# Patient Record
Sex: Female | Born: 1970 | Hispanic: Yes | Marital: Single | Smoking: Never smoker
Health system: Southern US, Community
[De-identification: ages and names within clinical notes are randomized; demographics above are authoritative.]

---

## 2019-09-30 ENCOUNTER — Emergency Department (HOSPITAL_COMMUNITY): Payer: No Typology Code available for payment source

## 2019-09-30 ENCOUNTER — Emergency Department (HOSPITAL_COMMUNITY)
Admission: EM | Admit: 2019-09-30 | Discharge: 2019-09-30 | Disposition: A | Discharge status: Home / Self Care | Payer: No Typology Code available for payment source | Attending: Emergency Medicine | Admitting: Emergency Medicine

## 2019-09-30 ENCOUNTER — Encounter (HOSPITAL_COMMUNITY): Payer: Self-pay | Admitting: Emergency Medicine

## 2019-09-30 DIAGNOSIS — S301XXA Contusion of abdominal wall, initial encounter: Secondary | ICD-10-CM | POA: Diagnosis not present

## 2019-09-30 DIAGNOSIS — S20212A Contusion of left front wall of thorax, initial encounter: Secondary | ICD-10-CM | POA: Diagnosis not present

## 2019-09-30 DIAGNOSIS — Y9289 Other specified places as the place of occurrence of the external cause: Secondary | ICD-10-CM | POA: Diagnosis not present

## 2019-09-30 DIAGNOSIS — T1490XA Injury, unspecified, initial encounter: Secondary | ICD-10-CM

## 2019-09-30 DIAGNOSIS — Y9389 Activity, other specified: Secondary | ICD-10-CM | POA: Insufficient documentation

## 2019-09-30 DIAGNOSIS — S40211A Abrasion of right shoulder, initial encounter: Secondary | ICD-10-CM | POA: Insufficient documentation

## 2019-09-30 DIAGNOSIS — Y990 Civilian activity done for income or pay: Secondary | ICD-10-CM | POA: Diagnosis not present

## 2019-09-30 DIAGNOSIS — M25559 Pain in unspecified hip: Secondary | ICD-10-CM

## 2019-09-30 DIAGNOSIS — W230XXA Caught, crushed, jammed, or pinched between moving objects, initial encounter: Secondary | ICD-10-CM | POA: Insufficient documentation

## 2019-09-30 DIAGNOSIS — M25552 Pain in left hip: Secondary | ICD-10-CM | POA: Diagnosis not present

## 2019-09-30 DIAGNOSIS — S299XXA Unspecified injury of thorax, initial encounter: Secondary | ICD-10-CM | POA: Diagnosis present

## 2019-09-30 LAB — CBC
HCT: 38.7 % (ref 36.0–46.0)
Hemoglobin: 12.7 g/dL (ref 12.0–15.0)
MCH: 30.6 pg (ref 26.0–34.0)
MCHC: 32.8 g/dL (ref 30.0–36.0)
MCV: 93.3 fL (ref 80.0–100.0)
Platelets: 203 10*3/uL (ref 150–400)
RBC: 4.15 MIL/uL (ref 3.87–5.11)
RDW: 12.9 % (ref 11.5–15.5)
WBC: 8.7 10*3/uL (ref 4.0–10.5)
nRBC: 0 % (ref 0.0–0.2)

## 2019-09-30 LAB — I-STAT CHEM 8, ED
BUN: 19 mg/dL (ref 6–20)
Calcium, Ion: 1.16 mmol/L (ref 1.15–1.40)
Chloride: 101 mmol/L (ref 98–111)
Creatinine, Ser: 0.5 mg/dL (ref 0.44–1.00)
Glucose, Bld: 143 mg/dL — ABNORMAL HIGH (ref 70–99)
HCT: 37 % (ref 36.0–46.0)
Hemoglobin: 12.6 g/dL (ref 12.0–15.0)
Potassium: 3.4 mmol/L — ABNORMAL LOW (ref 3.5–5.1)
Sodium: 138 mmol/L (ref 135–145)
TCO2: 28 mmol/L (ref 22–32)

## 2019-09-30 LAB — COMPREHENSIVE METABOLIC PANEL
ALT: 23 U/L (ref 0–44)
AST: 23 U/L (ref 15–41)
Albumin: 3.8 g/dL (ref 3.5–5.0)
Alkaline Phosphatase: 89 U/L (ref 38–126)
Anion gap: 9 (ref 5–15)
BUN: 17 mg/dL (ref 6–20)
CO2: 25 mmol/L (ref 22–32)
Calcium: 9.2 mg/dL (ref 8.9–10.3)
Chloride: 104 mmol/L (ref 98–111)
Creatinine, Ser: 0.51 mg/dL (ref 0.44–1.00)
GFR calc Af Amer: >60 mL/min (ref >60)
GFR calc non Af Amer: >60 mL/min (ref >60)
Glucose, Bld: 142 mg/dL — ABNORMAL HIGH (ref 70–99)
Potassium: 3.5 mmol/L (ref 3.5–5.1)
Sodium: 138 mmol/L (ref 135–145)
Total Bilirubin: 0.8 mg/dL (ref 0.3–1.2)
Total Protein: 7 g/dL (ref 6.5–8.1)

## 2019-09-30 LAB — URINALYSIS, ROUTINE W REFLEX MICROSCOPIC
Bacteria, UA: NONE SEEN
Bilirubin Urine: NEGATIVE
Glucose, UA: NEGATIVE mg/dL
Ketones, ur: NEGATIVE mg/dL
Leukocytes,Ua: NEGATIVE
Nitrite: NEGATIVE
Protein, ur: NEGATIVE mg/dL
Specific Gravity, Urine: 1.021 (ref 1.005–1.030)
pH: 7 (ref 5.0–8.0)

## 2019-09-30 LAB — CK: Total CK: 167 U/L (ref 38–234)

## 2019-09-30 LAB — I-STAT BETA HCG BLOOD, ED (MC, WL, AP ONLY): I-stat hCG, quantitative: <5 m[IU]/mL (ref <5)

## 2019-09-30 MED ORDER — NAPROXEN 375 MG PO TABS: 375.0000 mg | ORAL_TABLET | Freq: Two times a day (BID) | ORAL | 0 refills | Status: DC | PRN

## 2019-09-30 MED ORDER — FENTANYL CITRATE (PF) 100 MCG/2ML IJ SOLN
50.0000 ug | Freq: Once | INTRAMUSCULAR | Status: AC
  Administered 2019-09-30: 50 ug via INTRAVENOUS
  Filled 2019-09-30: qty 2

## 2019-09-30 MED ORDER — IOHEXOL 300 MG/ML  SOLN
100.0000 mL | Freq: Once | INTRAMUSCULAR | Status: AC | PRN
  Administered 2019-09-30: 100 mL via INTRAVENOUS

## 2019-09-30 NOTE — Progress Notes (Signed)
Orthopedic Tech Progress Note Patient Details:  Vicki Shepherd [birth year] [id number]  Ortho Devices Ortho Device/Splint Location: level 2 Trauma Ortho Device/Splint Interventions: Application   Post Interventions Patient Tolerated: Well Instructions Provided: Care of device   Vicki Shepherd 09/30/2019, 2:29 PM
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
## 2019-09-30 NOTE — ED Notes (Signed)
Patient and family verbalizes understanding of discharge instructions. Opportunity for questioning and answers were provided. Armband removed by staff, pt discharged from ED with family.
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
## 2022-06-11 ENCOUNTER — Encounter: Payer: Self-pay | Admitting: Emergency Medicine

## 2022-06-11 ENCOUNTER — Ambulatory Visit (INDEPENDENT_AMBULATORY_CARE_PROVIDER_SITE_OTHER): Payer: Self-pay

## 2022-06-11 ENCOUNTER — Ambulatory Visit
Admission: EM | Admit: 2022-06-11 | Discharge: 2022-06-11 | Disposition: A | Discharge status: Home / Self Care | Payer: No Typology Code available for payment source | Attending: Family Medicine | Admitting: Family Medicine

## 2022-06-11 DIAGNOSIS — S8001XA Contusion of right knee, initial encounter: Secondary | ICD-10-CM

## 2022-06-11 DIAGNOSIS — S8991XA Unspecified injury of right lower leg, initial encounter: Secondary | ICD-10-CM

## 2022-06-11 DIAGNOSIS — W19XXXA Unspecified fall, initial encounter: Secondary | ICD-10-CM

## 2022-06-11 DIAGNOSIS — S8012XA Contusion of left lower leg, initial encounter: Secondary | ICD-10-CM

## 2022-06-11 MED ORDER — IBUPROFEN 800 MG PO TABS: 800.0000 mg | ORAL_TABLET | Freq: Three times a day (TID) | ORAL | 0 refills | Status: DC

## 2022-06-11 NOTE — ED Triage Notes (Signed)
Patient slipped and fell this morning injuring her right knee.  Patient cannot apply any weight to that knee.  Patient has not taken any OTC pain meds.
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
## 2022-06-11 NOTE — Discharge Instructions (Signed)
Use ice and elevation to help with pain and swelling Take ibuprofen 3 times a day with food Wear brace for support Use crutches while walking If not improving in a couple days call for sports medicine consultation

## 2022-06-12 ENCOUNTER — Telehealth: Payer: Self-pay
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
## 2022-10-31 ENCOUNTER — Inpatient Hospital Stay (HOSPITAL_COMMUNITY)
Admission: EM | Admit: 2022-10-31 | Discharge: 2022-11-02 | DRG: 312 | Disposition: A | Discharge status: Home / Self Care | Payer: No Typology Code available for payment source | Attending: Internal Medicine | Admitting: Internal Medicine

## 2022-10-31 ENCOUNTER — Other Ambulatory Visit: Payer: Self-pay

## 2022-10-31 ENCOUNTER — Emergency Department (HOSPITAL_COMMUNITY): Payer: No Typology Code available for payment source

## 2022-10-31 DIAGNOSIS — E872 Acidosis, unspecified: Secondary | ICD-10-CM

## 2022-10-31 DIAGNOSIS — I951 Orthostatic hypotension: Principal | ICD-10-CM | POA: Diagnosis present

## 2022-10-31 DIAGNOSIS — S8011XA Contusion of right lower leg, initial encounter: Secondary | ICD-10-CM

## 2022-10-31 DIAGNOSIS — D72829 Elevated white blood cell count, unspecified: Secondary | ICD-10-CM

## 2022-10-31 DIAGNOSIS — S22089A Unspecified fracture of T11-T12 vertebra, initial encounter for closed fracture: Secondary | ICD-10-CM

## 2022-10-31 DIAGNOSIS — S91012A Laceration without foreign body, left ankle, initial encounter: Secondary | ICD-10-CM | POA: Diagnosis present

## 2022-10-31 DIAGNOSIS — Y9301 Activity, walking, marching and hiking: Secondary | ICD-10-CM | POA: Diagnosis present

## 2022-10-31 DIAGNOSIS — G8929 Other chronic pain: Secondary | ICD-10-CM | POA: Diagnosis present

## 2022-10-31 DIAGNOSIS — Y92481 Parking lot as the place of occurrence of the external cause: Secondary | ICD-10-CM

## 2022-10-31 DIAGNOSIS — S8002XA Contusion of left knee, initial encounter: Secondary | ICD-10-CM | POA: Diagnosis present

## 2022-10-31 DIAGNOSIS — M549 Dorsalgia, unspecified: Secondary | ICD-10-CM | POA: Diagnosis present

## 2022-10-31 DIAGNOSIS — R0789 Other chest pain: Secondary | ICD-10-CM | POA: Diagnosis present

## 2022-10-31 DIAGNOSIS — S22028A Other fracture of second thoracic vertebra, initial encounter for closed fracture: Secondary | ICD-10-CM

## 2022-10-31 DIAGNOSIS — Z23 Encounter for immunization: Secondary | ICD-10-CM

## 2022-10-31 DIAGNOSIS — R55 Syncope and collapse: Secondary | ICD-10-CM | POA: Diagnosis present

## 2022-10-31 DIAGNOSIS — S22088A Other fracture of T11-T12 vertebra, initial encounter for closed fracture: Secondary | ICD-10-CM

## 2022-10-31 DIAGNOSIS — K8020 Calculus of gallbladder without cholecystitis without obstruction: Secondary | ICD-10-CM

## 2022-10-31 DIAGNOSIS — S22029A Unspecified fracture of second thoracic vertebra, initial encounter for closed fracture: Secondary | ICD-10-CM

## 2022-10-31 LAB — LACTIC ACID, PLASMA: Lactic Acid, Venous: 2.7 mmol/L (ref 0.5–1.9)

## 2022-10-31 LAB — CBC
HCT: 39 % (ref 36.0–46.0)
HCT: 34.4 % — ABNORMAL LOW (ref 36.0–46.0)
Hemoglobin: 13.2 g/dL (ref 12.0–15.0)
Hemoglobin: 11.3 g/dL — ABNORMAL LOW (ref 12.0–15.0)
MCH: 31.4 pg (ref 26.0–34.0)
MCH: 31 pg (ref 26.0–34.0)
MCHC: 33.8 g/dL (ref 30.0–36.0)
MCHC: 32.8 g/dL (ref 30.0–36.0)
MCV: 92.9 fL (ref 80.0–100.0)
MCV: 94.5 fL (ref 80.0–100.0)
Platelets: 179 10*3/uL (ref 150–400)
Platelets: 182 10*3/uL (ref 150–400)
RBC: 4.2 MIL/uL (ref 3.87–5.11)
RBC: 3.64 MIL/uL — ABNORMAL LOW (ref 3.87–5.11)
RDW: 13.3 % (ref 11.5–15.5)
RDW: 13.2 % (ref 11.5–15.5)
WBC: 9.7 10*3/uL (ref 4.0–10.5)
WBC: 12.9 10*3/uL — ABNORMAL HIGH (ref 4.0–10.5)
nRBC: 0 % (ref 0.0–0.2)
nRBC: 0 % (ref 0.0–0.2)

## 2022-10-31 LAB — PROTIME-INR
INR: 1 (ref 0.8–1.2)
Prothrombin Time: 12.8 seconds (ref 11.4–15.2)

## 2022-10-31 LAB — I-STAT CHEM 8, ED
BUN: 16 mg/dL (ref 6–20)
Calcium, Ion: 1.1 mmol/L — ABNORMAL LOW (ref 1.15–1.40)
Chloride: 104 mmol/L (ref 98–111)
Creatinine, Ser: 0.4 mg/dL — ABNORMAL LOW (ref 0.44–1.00)
Glucose, Bld: 134 mg/dL — ABNORMAL HIGH (ref 70–99)
HCT: 39 % (ref 36.0–46.0)
Hemoglobin: 13.3 g/dL (ref 12.0–15.0)
Potassium: 3.6 mmol/L (ref 3.5–5.1)
Sodium: 139 mmol/L (ref 135–145)
TCO2: 25 mmol/L (ref 22–32)

## 2022-10-31 LAB — COMPREHENSIVE METABOLIC PANEL
ALT: 20 U/L (ref 0–44)
AST: 24 U/L (ref 15–41)
Albumin: 3.9 g/dL (ref 3.5–5.0)
Alkaline Phosphatase: 81 U/L (ref 38–126)
Anion gap: 7 (ref 5–15)
BUN: 14 mg/dL (ref 6–20)
CO2: 25 mmol/L (ref 22–32)
Calcium: 9.2 mg/dL (ref 8.9–10.3)
Chloride: 104 mmol/L (ref 98–111)
Creatinine, Ser: 0.6 mg/dL (ref 0.44–1.00)
GFR, Estimated: >60 mL/min (ref >60)
Glucose, Bld: 136 mg/dL — ABNORMAL HIGH (ref 70–99)
Potassium: 3.6 mmol/L (ref 3.5–5.1)
Sodium: 136 mmol/L (ref 135–145)
Total Bilirubin: 0.5 mg/dL (ref 0.3–1.2)
Total Protein: 7.3 g/dL (ref 6.5–8.1)

## 2022-10-31 LAB — ETHANOL: Alcohol, Ethyl (B): <10 mg/dL (ref <10)

## 2022-10-31 LAB — URINALYSIS, ROUTINE W REFLEX MICROSCOPIC
Bacteria, UA: NONE SEEN
Bilirubin Urine: NEGATIVE
Glucose, UA: NEGATIVE mg/dL
Ketones, ur: NEGATIVE mg/dL
Leukocytes,Ua: NEGATIVE
Nitrite: NEGATIVE
Protein, ur: NEGATIVE mg/dL
Specific Gravity, Urine: 1.017 (ref 1.005–1.030)
pH: 7 (ref 5.0–8.0)

## 2022-10-31 LAB — SAMPLE TO BLOOD BANK

## 2022-10-31 LAB — BASIC METABOLIC PANEL
Anion gap: 9 (ref 5–15)
BUN: 10 mg/dL (ref 6–20)
CO2: 25 mmol/L (ref 22–32)
Calcium: 8.6 mg/dL — ABNORMAL LOW (ref 8.9–10.3)
Chloride: 105 mmol/L (ref 98–111)
Creatinine, Ser: 0.54 mg/dL (ref 0.44–1.00)
GFR, Estimated: >60 mL/min (ref >60)
Glucose, Bld: 115 mg/dL — ABNORMAL HIGH (ref 70–99)
Potassium: 4 mmol/L (ref 3.5–5.1)
Sodium: 139 mmol/L (ref 135–145)

## 2022-10-31 LAB — CBG MONITORING, ED: Glucose-Capillary: 106 mg/dL — ABNORMAL HIGH (ref 70–99)

## 2022-10-31 LAB — PHOSPHORUS: Phosphorus: 3.9 mg/dL (ref 2.5–4.6)

## 2022-10-31 LAB — MAGNESIUM: Magnesium: 1.8 mg/dL (ref 1.7–2.4)

## 2022-10-31 LAB — TROPONIN I (HIGH SENSITIVITY): Troponin I (High Sensitivity): 2 ng/L (ref <18)

## 2022-10-31 MED ORDER — IOHEXOL 350 MG/ML SOLN
75.0000 mL | Freq: Once | INTRAVENOUS | Status: AC | PRN
  Administered 2022-10-31: 75 mL via INTRAVENOUS

## 2022-10-31 MED ORDER — LACTATED RINGERS IV BOLUS
1000.0000 mL | Freq: Once | INTRAVENOUS | Status: AC
  Administered 2022-10-31: 1000 mL via INTRAVENOUS

## 2022-10-31 MED ORDER — HYDROCODONE-ACETAMINOPHEN 5-325 MG PO TABS: 1.0000 | ORAL_TABLET | Freq: Four times a day (QID) | ORAL | 0 refills | Status: DC | PRN

## 2022-10-31 MED ORDER — IBUPROFEN 600 MG PO TABS
600.0000 mg | ORAL_TABLET | ORAL | Status: DC | PRN
  Administered 2022-11-01 (×2): 600 mg via ORAL
  Filled 2022-10-31 (×2): qty 1

## 2022-10-31 MED ORDER — SODIUM CHLORIDE 0.9% FLUSH
3.0000 mL | Freq: Two times a day (BID) | INTRAVENOUS | Status: DC
  Administered 2022-11-01: 3 mL via INTRAVENOUS

## 2022-10-31 MED ORDER — SODIUM CHLORIDE 0.9 % IV SOLN: INTRAVENOUS | Status: AC

## 2022-10-31 MED ORDER — LIDOCAINE-EPINEPHRINE (PF) 2 %-1:200000 IJ SOLN
10.0000 mL | Freq: Once | INTRAMUSCULAR | Status: AC
  Administered 2022-10-31: 10 mL
  Filled 2022-10-31: qty 20

## 2022-10-31 MED ORDER — CEPHALEXIN 500 MG PO CAPS: 500.0000 mg | ORAL_CAPSULE | Freq: Three times a day (TID) | ORAL | 0 refills | Status: AC

## 2022-10-31 MED ORDER — FENTANYL CITRATE PF 50 MCG/ML IJ SOSY
50.0000 ug | PREFILLED_SYRINGE | Freq: Once | INTRAMUSCULAR | Status: AC
  Administered 2022-10-31: 50 ug via INTRAVENOUS

## 2022-10-31 MED ORDER — HYDROCODONE-ACETAMINOPHEN 5-325 MG PO TABS
1.0000 | ORAL_TABLET | Freq: Once | ORAL | Status: AC
  Administered 2022-10-31: 1 via ORAL
  Filled 2022-10-31: qty 1

## 2022-10-31 MED ORDER — CEFAZOLIN SODIUM-DEXTROSE 2-4 GM/100ML-% IV SOLN
2.0000 g | Freq: Once | INTRAVENOUS | Status: AC
  Administered 2022-10-31: 2 g via INTRAVENOUS

## 2022-10-31 MED ORDER — TRAMADOL HCL 50 MG PO TABS: 50.0000 mg | ORAL_TABLET | Freq: Four times a day (QID) | ORAL | Status: DC | PRN

## 2022-10-31 MED ORDER — LACTATED RINGERS IV BOLUS
2000.0000 mL | Freq: Once | INTRAVENOUS | Status: AC
  Administered 2022-10-31: 2000 mL via INTRAVENOUS

## 2022-10-31 MED ORDER — LIDOCAINE-EPINEPHRINE-TETRACAINE (LET) TOPICAL GEL
3.0000 mL | Freq: Once | TOPICAL | Status: AC
  Administered 2022-10-31: 3 mL via TOPICAL
  Filled 2022-10-31: qty 3

## 2022-10-31 MED ORDER — TETANUS-DIPHTH-ACELL PERTUSSIS 5-2.5-18.5 LF-MCG/0.5 IM SUSY
0.5000 mL | PREFILLED_SYRINGE | Freq: Once | INTRAMUSCULAR | Status: AC
Start: 2022-10-31 — End: 2022-10-31
  Administered 2022-10-31: 0.5 mL via INTRAMUSCULAR
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
## 2022-10-31 NOTE — ED Notes (Signed)
MD at bedside. 
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
## 2022-10-31 NOTE — Progress Notes (Signed)
Orthopedic Tech Progress Note Patient Details:  Jalee Saine [facility] [birth year] [id number]  Applied back brace to patient with family at bedside, went to stand patient up to fix brace and  to see if patient could used crutches. Put patient back on th eside of the bed and she kinda checked out for a few seconds, I had someone go and get the RN by time RN came in room patient was back awake. RN reached out to MD. I had saw PT walking and told them about the CRUTCHES and they want to work with her just to make sure crutches are ok or if she needs a walker    Ortho Devices Type of Ortho Device: Thoracolumbar corset (TLSO) Ortho Device/Splint Location: BACK Ortho Device/Splint Interventions: Ordered, Application, Adjustment   Post Interventions Patient Tolerated: Well Instructions Provided: Care of device  [name] 10/31/2022, 11:43 AM

## 2022-10-31 NOTE — ED Notes (Signed)
Pt has hematoma to anterior left thigh.
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
## 2022-10-31 NOTE — ED Notes (Signed)
Ortho tech at bedside 

## 2022-10-31 NOTE — Assessment & Plan Note (Signed)
-  CT A/P revealed a single solitary gallstone to neck of gallbladder. Asymptomatic at this time.
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
## 2022-10-31 NOTE — ED Notes (Signed)
This RN was discharging pt, RN wheelchaired pt out to lobby, pt had to use bathroom. RN wheelchaired pt to bathroom. Pt sat on toilet with no issues. After using the restroom, pt stood up from the toilet and felt dizzy. Pt stated she felt like she was going to pass out, pale, diaphoretic. RN got pt back in wheelchair and notified MD.
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
## 2022-11-01 ENCOUNTER — Observation Stay (HOSPITAL_BASED_OUTPATIENT_CLINIC_OR_DEPARTMENT_OTHER): Payer: No Typology Code available for payment source

## 2022-11-01 DIAGNOSIS — I951 Orthostatic hypotension: Secondary | ICD-10-CM | POA: Diagnosis present

## 2022-11-01 DIAGNOSIS — R0789 Other chest pain: Secondary | ICD-10-CM | POA: Diagnosis present

## 2022-11-01 DIAGNOSIS — S91012A Laceration without foreign body, left ankle, initial encounter: Secondary | ICD-10-CM | POA: Diagnosis present

## 2022-11-01 DIAGNOSIS — Z23 Encounter for immunization: Secondary | ICD-10-CM | POA: Diagnosis not present

## 2022-11-01 DIAGNOSIS — D72829 Elevated white blood cell count, unspecified: Secondary | ICD-10-CM | POA: Diagnosis present

## 2022-11-01 DIAGNOSIS — R55 Syncope and collapse: Secondary | ICD-10-CM

## 2022-11-01 DIAGNOSIS — K8020 Calculus of gallbladder without cholecystitis without obstruction: Secondary | ICD-10-CM | POA: Diagnosis present

## 2022-11-01 DIAGNOSIS — S8002XA Contusion of left knee, initial encounter: Secondary | ICD-10-CM | POA: Diagnosis present

## 2022-11-01 DIAGNOSIS — E872 Acidosis, unspecified: Secondary | ICD-10-CM | POA: Diagnosis present

## 2022-11-01 DIAGNOSIS — M549 Dorsalgia, unspecified: Secondary | ICD-10-CM | POA: Diagnosis present

## 2022-11-01 DIAGNOSIS — G8929 Other chronic pain: Secondary | ICD-10-CM | POA: Diagnosis present

## 2022-11-01 DIAGNOSIS — Y92481 Parking lot as the place of occurrence of the external cause: Secondary | ICD-10-CM | POA: Diagnosis not present

## 2022-11-01 DIAGNOSIS — Y9301 Activity, walking, marching and hiking: Secondary | ICD-10-CM | POA: Diagnosis present

## 2022-11-01 DIAGNOSIS — S22029A Unspecified fracture of second thoracic vertebra, initial encounter for closed fracture: Secondary | ICD-10-CM | POA: Diagnosis present

## 2022-11-01 LAB — CBC
HCT: 33.9 % — ABNORMAL LOW (ref 36.0–46.0)
Hemoglobin: 11.2 g/dL — ABNORMAL LOW (ref 12.0–15.0)
MCH: 31.3 pg (ref 26.0–34.0)
MCHC: 33 g/dL (ref 30.0–36.0)
MCV: 94.7 fL (ref 80.0–100.0)
Platelets: 157 10*3/uL (ref 150–400)
RBC: 3.58 MIL/uL — ABNORMAL LOW (ref 3.87–5.11)
RDW: 13.4 % (ref 11.5–15.5)
WBC: 8.4 10*3/uL (ref 4.0–10.5)
nRBC: 0 % (ref 0.0–0.2)

## 2022-11-01 LAB — CBG MONITORING, ED: Glucose-Capillary: 109 mg/dL — ABNORMAL HIGH (ref 70–99)

## 2022-11-01 LAB — BASIC METABOLIC PANEL
Anion gap: 8 (ref 5–15)
BUN: <5 mg/dL — ABNORMAL LOW (ref 6–20)
CO2: 27 mmol/L (ref 22–32)
Calcium: 8.9 mg/dL (ref 8.9–10.3)
Chloride: 104 mmol/L (ref 98–111)
Creatinine, Ser: 0.46 mg/dL (ref 0.44–1.00)
GFR, Estimated: >60 mL/min (ref >60)
Glucose, Bld: 123 mg/dL — ABNORMAL HIGH (ref 70–99)
Potassium: 3.9 mmol/L (ref 3.5–5.1)
Sodium: 139 mmol/L (ref 135–145)

## 2022-11-01 LAB — ECHOCARDIOGRAM COMPLETE
Area-P 1/2: 4.06 cm2
Calc EF: 55 %
Height: 68 in
S' Lateral: 3.3 cm
Single Plane A2C EF: 54.1 %
Single Plane A4C EF: 55.2 %
Weight: 2880 oz

## 2022-11-01 MED ORDER — MELATONIN 5 MG PO TABS
5.0000 mg | ORAL_TABLET | Freq: Every evening | ORAL | Status: DC | PRN
  Administered 2022-11-01: 5 mg via ORAL
  Filled 2022-11-01: qty 1

## 2022-11-01 MED ORDER — SODIUM CHLORIDE 0.9 % IV SOLN: INTRAVENOUS | Status: AC

## 2022-11-01 NOTE — Progress Notes (Signed)
Echocardiogram 2D Echocardiogram has been performed.  Vicki Shepherd 11/01/2022, 11:01 AM

## 2022-11-01 NOTE — ED Notes (Signed)
This RN cleaned pt up and changed pt's bed linen and applied a new diaper and purwick. Will continue to monitor.
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
## 2022-11-02 MED ORDER — TRAMADOL HCL 50 MG PO TABS: 50.0000 mg | ORAL_TABLET | Freq: Four times a day (QID) | ORAL | 0 refills | Status: AC | PRN

## 2022-11-02 MED ORDER — IBUPROFEN 600 MG PO TABS: 600.0000 mg | ORAL_TABLET | Freq: Three times a day (TID) | ORAL | 0 refills | Status: AC | PRN
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
## 2022-11-02 NOTE — TOC Progression Note (Signed)
Transition of Care [facility]) - Progression Note    Patient Details  Name: Philippa Vessey MRN: [id number] Date of Birth: 07/13/1971  [facility] CM/SW Contact  Janae Bridgeman, RN Phone Number: 11/02/2022, 8:55 AM  Clinical Narrative:    Patient has no listed PCP and will need a PCP follow up post-hospitalization.  PCP appointment was scheduled with Milton Ferguson, PA on November 23, 2021 at 2 pm and placed in the discharge instructions.        Expected Discharge Plan and Services         Expected Discharge Date: 11/02/22                                     Social Determinants of Health (SDOH) Interventions SDOH Screenings   Food Insecurity: No Food Insecurity (11/01/2022)  Housing: Low Risk  (11/01/2022)  Transportation Needs: No Transportation Needs (11/01/2022)  Utilities: Not At Risk (11/01/2022)  Tobacco Use: Low Risk  (06/11/2022)    Readmission Risk Interventions     No data to display

## 2022-11-02 NOTE — Plan of Care (Signed)
